# Patient Record
Sex: Male | Born: 1945 | Race: White | Hispanic: No | Marital: Married | Smoking: Never smoker
Health system: Southern US, Community
[De-identification: ages and names within clinical notes are randomized; demographics above are authoritative.]

## PROBLEM LIST (undated history)

## (undated) HISTORY — PX: KNEE ARTHROPLASTY: SHX992

## (undated) HISTORY — PX: TONSILLECTOMY: SUR1361

---

## 2005-02-21 ENCOUNTER — Ambulatory Visit: Payer: Self-pay | Admitting: Family Medicine

## 2005-05-31 ENCOUNTER — Ambulatory Visit: Payer: Self-pay | Admitting: Family Medicine

## 2005-11-22 ENCOUNTER — Ambulatory Visit: Payer: Self-pay | Admitting: Family Medicine

## 2006-05-16 ENCOUNTER — Ambulatory Visit: Payer: Self-pay | Admitting: Family Medicine

## 2015-08-22 ENCOUNTER — Emergency Department (HOSPITAL_COMMUNITY): Payer: Medicare Other

## 2015-08-22 ENCOUNTER — Emergency Department (HOSPITAL_COMMUNITY)
Admission: EM | Admit: 2015-08-22 | Discharge: 2015-08-22 | Disposition: A | Discharge status: Home / Self Care | Payer: Medicare Other | Attending: Emergency Medicine | Admitting: Emergency Medicine

## 2015-08-22 ENCOUNTER — Encounter (HOSPITAL_COMMUNITY): Payer: Self-pay | Admitting: Emergency Medicine

## 2015-08-22 DIAGNOSIS — R1030 Lower abdominal pain, unspecified: Secondary | ICD-10-CM | POA: Insufficient documentation

## 2015-08-22 LAB — URINALYSIS, ROUTINE W REFLEX MICROSCOPIC
Bilirubin Urine: NEGATIVE
GLUCOSE, UA: NEGATIVE mg/dL
Hgb urine dipstick: NEGATIVE
Ketones, ur: NEGATIVE mg/dL
LEUKOCYTES UA: NEGATIVE
NITRITE: NEGATIVE
PH: 7 (ref 5.0–8.0)
Protein, ur: NEGATIVE mg/dL
SPECIFIC GRAVITY, URINE: 1.01 (ref 1.005–1.030)

## 2015-08-22 LAB — COMPREHENSIVE METABOLIC PANEL
ALT: 19 U/L (ref 17–63)
ANION GAP: 6 (ref 5–15)
AST: 22 U/L (ref 15–41)
Albumin: 3.9 g/dL (ref 3.5–5.0)
Alkaline Phosphatase: 45 U/L (ref 38–126)
BUN: 12 mg/dL (ref 6–20)
CHLORIDE: 106 mmol/L (ref 101–111)
CO2: 26 mmol/L (ref 22–32)
Calcium: 8.6 mg/dL — ABNORMAL LOW (ref 8.9–10.3)
Creatinine, Ser: 0.83 mg/dL (ref 0.61–1.24)
GFR calc Af Amer: >60 mL/min (ref >60)
Glucose, Bld: 95 mg/dL (ref 65–99)
POTASSIUM: 3.9 mmol/L (ref 3.5–5.1)
Sodium: 138 mmol/L (ref 135–145)
Total Bilirubin: 0.5 mg/dL (ref 0.3–1.2)
Total Protein: 7.2 g/dL (ref 6.5–8.1)

## 2015-08-22 LAB — CBC WITH DIFFERENTIAL/PLATELET
BASOS ABS: 0 10*3/uL (ref 0.0–0.1)
BASOS PCT: 0 %
Eosinophils Absolute: 0.1 10*3/uL (ref 0.0–0.7)
Eosinophils Relative: 2 %
HEMATOCRIT: 38.3 % — AB (ref 39.0–52.0)
Hemoglobin: 13.4 g/dL (ref 13.0–17.0)
LYMPHS PCT: 31 %
Lymphs Abs: 1.7 10*3/uL (ref 0.7–4.0)
MCH: 30.9 pg (ref 26.0–34.0)
MCHC: 35 g/dL (ref 30.0–36.0)
MCV: 88.2 fL (ref 78.0–100.0)
MONO ABS: 0.4 10*3/uL (ref 0.1–1.0)
Monocytes Relative: 7 %
NEUTROS ABS: 3.2 10*3/uL (ref 1.7–7.7)
NEUTROS PCT: 60 %
Platelets: 211 10*3/uL (ref 150–400)
RBC: 4.34 MIL/uL (ref 4.22–5.81)
RDW: 12 % (ref 11.5–15.5)
WBC: 5.4 10*3/uL (ref 4.0–10.5)

## 2015-08-22 LAB — LIPASE, BLOOD: LIPASE: 51 U/L (ref 11–51)

## 2015-08-22 MED ORDER — ONDANSETRON HCL 4 MG/2ML IJ SOLN
4.0000 mg | Freq: Once | INTRAMUSCULAR | Status: AC
  Administered 2015-08-22: 4 mg via INTRAVENOUS
  Filled 2015-08-22: qty 2

## 2015-08-22 MED ORDER — OXYCODONE-ACETAMINOPHEN 5-325 MG PO TABS: 1.0000 | ORAL_TABLET | ORAL | Status: AC | PRN

## 2015-08-22 MED ORDER — MORPHINE SULFATE (PF) 4 MG/ML IV SOLN
4.0000 mg | Freq: Once | INTRAVENOUS | Status: AC
  Administered 2015-08-22: 4 mg via INTRAVENOUS
  Filled 2015-08-22: qty 1

## 2015-08-22 MED ORDER — SODIUM CHLORIDE 0.9 % IV SOLN
Freq: Once | INTRAVENOUS | Status: AC
  Administered 2015-08-22: 100 mL/h via INTRAVENOUS

## 2015-08-22 MED ORDER — IOHEXOL 300 MG/ML  SOLN
100.0000 mL | Freq: Once | INTRAMUSCULAR | Status: AC | PRN
  Administered 2015-08-22: 100 mL via INTRAVENOUS

## 2015-08-22 NOTE — ED Provider Notes (Signed)
CSN: [id number]     Arrival date & time 08/22/15  0204 History   First MD Initiated Contact with Patient 08/22/15 [phone]     Chief Complaint  Patient presents with  . Abdominal Pain     (Consider location/radiation/quality/duration/timing/severity/associated sxs/prior Treatment) Patient is a 70 y.o. male presenting with abdominal pain. The history is provided by the patient.  Abdominal Pain He started getting sick all week ago with diarrhea which had resolved. He then developed some pain in the suprapubic area. He states it as a grabbing, steady pain. He went to the [facility] last night and was told that he had a estimate virus and was given a prescription for dicyclomine. However, today, that pain got significantly worse. He was writhing on the floor at home. He rated pain at 10/10. There is no associated nausea and no fever or chills. He states that he did go fishing and got his boat out of the water with no worsening of the pain with that type of exertion. Also, after his diarrhea subsided, he thought he might of become constipated and he gave himself an enema yesterday with successful reduction of a bowel movement but no change in his pain. He's never had symptoms like this before.  History reviewed. No pertinent past medical history. Past Surgical History  Procedure Laterality Date  . Tonsillectomy    . Knee arthroplasty      right   No family history on file. Social History  Substance Use Topics  . Smoking status: Never Smoker   . Smokeless tobacco: None  . Alcohol Use: No    Review of Systems  Gastrointestinal: Positive for abdominal pain.  All other systems reviewed and are negative.     Allergies  Review of patient's allergies indicates not on file.  Home Medications   Prior to Admission medications   Not on File   BP 157/92 mmHg  Pulse 55  Temp(Src) 97.7 F (36.5 C) (Oral)  Resp 20  Ht 6' (1.829 m)  Wt 185 lb (83.915 kg)  BMI 25.08 kg/m2  SpO2  97% Physical Exam  Nursing note and vitals reviewed.  70 year old male, resting comfortably and in no acute distress. Vital signs are significant for hypertension and bradycardia. Oxygen saturation is 97%, which is normal. Head is normocephalic and atraumatic. PERRLA, EOMI. Oropharynx is clear. Neck is nontender and supple without adenopathy or JVD. Back is nontender and there is no CVA tenderness. Lungs are clear without rales, wheezes, or rhonchi. Chest is nontender. Heart has regular rate and rhythm without murmur. Abdomen is soft, flat, nontender without masses or hepatosplenomegaly and peristalsis is hypoactive. Extremities have no cyanosis or edema, full range of motion is present. Skin is warm and dry without rash. Neurologic: Mental status is normal, cranial nerves are intact, there are no motor or sensory deficits.  ED Course  Procedures (including critical care time) Labs Review Results for orders placed or performed during the hospital encounter of 08/22/15  Comprehensive metabolic panel  Result Value Ref Range   Sodium 138 135 - 145 mmol/L   Potassium 3.9 3.5 - 5.1 mmol/L   Chloride 106 101 - 111 mmol/L   CO2 26 22 - 32 mmol/L   Glucose, Bld 95 65 - 99 mg/dL   BUN 12 6 - 20 mg/dL   Creatinine, Ser 1.19 0.61 - 1.24 mg/dL   Calcium 8.6 (L) 8.9 - 10.3 mg/dL   Total Protein 7.2 6.5 - 8.1 g/dL   Albumin  3.9 3.5 - 5.0 g/dL   AST 22 15 - 41 U/L   ALT 19 17 - 63 U/L   Alkaline Phosphatase 45 38 - 126 U/L   Total Bilirubin 0.5 0.3 - 1.2 mg/dL   GFR calc non Af Amer >60 >60 mL/min   GFR calc Af Amer >60 >60 mL/min   Anion gap 6 5 - 15  Lipase, blood  Result Value Ref Range   Lipase 51 11 - 51 U/L  CBC with Differential  Result Value Ref Range   WBC 5.4 4.0 - 10.5 K/uL   RBC 4.34 4.22 - 5.81 MIL/uL   Hemoglobin 13.4 13.0 - 17.0 g/dL   HCT 38.3 (L)16.109.6 - 04.5 %   MCV 88.2 78.0 - 100.0 fL   MCH 30.9 26.0 - 34.0 pg   MCHC 35.0 30.0 - 36.0 g/dL   RDW 40.9 81.1 - 91.4  %   Platelets 211 150 - 400 K/uL   Neutrophils Relative % 60 %   Neutro Abs 3.2 1.7 - 7.7 K/uL   Lymphocytes Relative 31 %   Lymphs Abs 1.7 0.7 - 4.0 K/uL   Monocytes Relative 7 %   Monocytes Absolute 0.4 0.1 - 1.0 K/uL   Eosinophils Relative 2 %   Eosinophils Absolute 0.1 0.0 - 0.7 K/uL   Basophils Relative 0 %   Basophils Absolute 0.0 0.0 - 0.1 K/uL  Urinalysis, Routine w reflex microscopic  Result Value Ref Range   Color, Urine YELLOW YELLOW   APPearance CLEAR CLEAR   Specific Gravity, Urine 1.010 1.005 - 1.030   pH 7.0 5.0 - 8.0   Glucose, UA NEGATIVE NEGATIVE mg/dL   Hgb urine dipstick NEGATIVE NEGATIVE   Bilirubin Urine NEGATIVE NEGATIVE   Ketones, ur NEGATIVE NEGATIVE mg/dL   Protein, ur NEGATIVE NEGATIVE mg/dL   Nitrite NEGATIVE NEGATIVE   Leukocytes, UA NEGATIVE NEGATIVE   Imaging Review Ct Abdomen Pelvis W Contrast  08/22/2015  CLINICAL DATA:  Acute onset of bilateral lower quadrant abdominal pain, nausea and vomiting. Initial encounter. EXAM: CT ABDOMEN AND PELVIS WITH CONTRAST TECHNIQUE: Multidetector CT imaging of the abdomen and pelvis was performed using the standard protocol following bolus administration of intravenous contrast. CONTRAST:  OMNIPAQUE IOHEXOL 300 MG/ML  SOLN COMPARISON:  None. FINDINGS: Minimal right basilar atelectasis is noted. Diffuse coronary artery calcifications are seen. Hypodensities within the liver measure up to 2.7 cm in size. These are nonspecific but may reflect small cysts. The spleen is unremarkable in appearance. The gallbladder is within normal limits. The pancreas and adrenal glands are unremarkable. The kidneys are unremarkable in appearance. There is no evidence of hydronephrosis. No renal or ureteral stones are seen. No perinephric stranding is appreciated. No free fluid is identified. The small bowel is unremarkable in appearance. The stomach is within normal limits. No acute vascular abnormalities are seen. Minimal calcification  is noted along the abdominal aorta and its branches. The appendix is normal in caliber and somewhat evidence of appendicitis. The colon is unremarkable in appearance. The bladder is moderately distended and grossly unremarkable. The prostate is mildly enlarged, measuring 5.2 cm in transverse dimension, with minimal calcification. No inguinal lymphadenopathy is seen. No acute osseous abnormalities are identified. IMPRESSION: 1. No acute abnormality seen within the abdomen or pelvis. 2. Nonspecific hypodensities within the liver may reflect small cysts. 3. Diffuse coronary artery calcifications seen. 4. Mildly enlarged prostate. Electronically Signed   By: Roanna Raider M.D.   On: 08/22/2015 06:23  I have personally reviewed and evaluated these images and lab results as part of my medical decision-making.   MDM   Final diagnoses:  Suprapubic pain, acute, unspecified laterality    Suprapubic pain of uncertain cause. He had received a dose of morphine in the ED prior to my seeing him and he states he feels much better following that. Laboratory workup is unremarkable with normal CBC and urinalysis. His description of pain at home and is suspicious for possible renal colic. He will be sent for CT of abdomen and pelvis. Old records are reviewed and he has no relevant past visits.   CT is unremarkable. Cause for pain is obscure. He continues to be pain-free following initial small dose of  Morphine.  Patient is reassured of negative CT scan and discharged with prescription for oxycodone have acetaminophen. Return precautions given.  Dione Booze, MD 08/22/15 [phone]

## 2015-08-22 NOTE — ED Notes (Signed)
Pt states he had diarrhea earlier last week, seen at [place] thought was stomach virus, pain getting worse.

## 2015-08-22 NOTE — Discharge Instructions (Signed)
Your tests did not show the cause for ear pain. Urinalysis showed no evidence of infection , and CT scan showed no evidence of infection or kidney stone. Cathie Hoops may take the oxycodone-acetaminophen as needed for pain, but return if pain seems to be getting worse or if you start running a fever.  Abdominal Pain, Adult Many things can cause abdominal pain. Usually, abdominal pain is not caused by a disease and will improve without treatment. It can often be observed and treated at home. Your health care provider will do a physical exam and possibly order blood tests and X-rays to help determine the seriousness of your pain. However, in many cases, more time must pass before a clear cause of the pain can be found. Before that point, your health care provider may not know if you need more testing or further treatment. HOME CARE INSTRUCTIONS Monitor your abdominal pain for any changes. The following actions may help to alleviate any discomfort you are experiencing:  Only take over-the-counter or prescription medicines as directed by your health care provider.  Do not take laxatives unless directed to do so by your health care provider.  Try a clear liquid diet (broth, tea, or water) as directed by your health care provider. Slowly move to a bland diet as tolerated. SEEK MEDICAL CARE IF:  You have unexplained abdominal pain.  You have abdominal pain associated with nausea or diarrhea.  You have pain when you urinate or have a bowel movement.  You experience abdominal pain that wakes you in the night.  You have abdominal pain that is worsened or improved by eating food.  You have abdominal pain that is worsened with eating fatty foods.  You have a fever. SEEK IMMEDIATE MEDICAL CARE IF:  Your pain does not go away within 2 hours.  You keep throwing up (vomiting).  Your pain is felt only in portions of the abdomen, such as the right side or the left lower portion of the abdomen.  You pass  bloody or black tarry stools. MAKE SURE YOU:  Understand these instructions.  Will watch your condition.  Will get help right away if you are not doing well or get worse.   This information is not intended to replace advice given to you by your health care provider. Make sure you discuss any questions you have with your health care provider.   Document Released: 03/22/2005 Document Revised: 03/03/2015 Document Reviewed: 02/19/2013 Elsevier Interactive Patient Education 2016 [employer]  Acetaminophen; Oxycodone tablets What is this medicine? ACETAMINOPHEN; OXYCODONE (a set a MEE noe fen; ox i KOE done) is a pain reliever. It is used to treat moderate to severe pain. This medicine may be used for other purposes; ask your health care provider or pharmacist if you have questions. What should I tell my health care provider before I take this medicine? They need to know if you have any of these conditions: -brain tumor -Crohn's disease, inflammatory bowel disease, or ulcerative colitis -drug abuse or addiction -head injury -heart or circulation problems -if you often drink alcohol -kidney disease or problems going to the bathroom -liver disease -lung disease, asthma, or breathing problems -an unusual or allergic reaction to acetaminophen, oxycodone, other opioid analgesics, other medicines, foods, dyes, or preservatives -pregnant or trying to get pregnant -breast-feeding How should I use this medicine? Take this medicine by mouth with a full glass of water. Follow the directions on the prescription label. You can take it with or without food. If it  upsets your stomach, take it with food. Take your medicine at regular intervals. Do not take it more often than directed. Talk to your pediatrician regarding the use of this medicine in children. Special care may be needed. Patients over 32 years old may have a stronger reaction and need a smaller dose. Overdosage: If you think you have  taken too much of this medicine contact a poison control center or emergency room at once. NOTE: This medicine is only for you. Do not share this medicine with others. What if I miss a dose? If you miss a dose, take it as soon as you can. If it is almost time for your next dose, take only that dose. Do not take double or extra doses. What may interact with this medicine? -alcohol -antihistamines -barbiturates like amobarbital, butalbital, butabarbital, methohexital, pentobarbital, phenobarbital, thiopental, and secobarbital -benztropine -drugs for bladder problems like solifenacin, trospium, oxybutynin, tolterodine, hyoscyamine, and methscopolamine -drugs for breathing problems like ipratropium and tiotropium -drugs for certain stomach or intestine problems like propantheline, homatropine methylbromide, glycopyrrolate, atropine, belladonna, and dicyclomine -general anesthetics like etomidate, ketamine, nitrous oxide, propofol, desflurane, enflurane, halothane, isoflurane, and sevoflurane -medicines for depression, anxiety, or psychotic disturbances -medicines for sleep -muscle relaxants -naltrexone -narcotic medicines (opiates) for pain -phenothiazines like perphenazine, thioridazine, chlorpromazine, mesoridazine, fluphenazine, prochlorperazine, promazine, and trifluoperazine -scopolamine -tramadol -trihexyphenidyl This list may not describe all possible interactions. Give your health care provider a list of all the medicines, herbs, non-prescription drugs, or dietary supplements you use. Also tell them if you smoke, drink alcohol, or use illegal drugs. Some items may interact with your medicine. What should I watch for while using this medicine? Tell your doctor or health care professional if your pain does not go away, if it gets worse, or if you have new or a different type of pain. You may develop tolerance to the medicine. Tolerance means that you will need a higher dose of the  medication for pain relief. Tolerance is normal and is expected if you take this medicine for a long time. Do not suddenly stop taking your medicine because you may develop a severe reaction. Your body becomes used to the medicine. This does NOT mean you are addicted. Addiction is a behavior related to getting and using a drug for a non-medical reason. If you have pain, you have a medical reason to take pain medicine. Your doctor will tell you how much medicine to take. If your doctor wants you to stop the medicine, the dose will be slowly lowered over time to avoid any side effects. You may get drowsy or dizzy. Do not drive, use machinery, or do anything that needs mental alertness until you know how this medicine affects you. Do not stand or sit up quickly, especially if you are an older patient. This reduces the risk of dizzy or fainting spells. Alcohol may interfere with the effect of this medicine. Avoid alcoholic drinks. There are different types of narcotic medicines (opiates) for pain. If you take more than one type at the same time, you may have more side effects. Give your health care provider a list of all medicines you use. Your doctor will tell you how much medicine to take. Do not take more medicine than directed. Call emergency for help if you have problems breathing. The medicine will cause constipation. Try to have a bowel movement at least every 2 to 3 days. If you do not have a bowel movement for 3 days, call your doctor or health  care professional. Do not take Tylenol (acetaminophen) or medicines that have acetaminophen with this medicine. Too much acetaminophen can be very dangerous. Many nonprescription medicines contain acetaminophen. Always read the labels carefully to avoid taking more acetaminophen. What side effects may I notice from receiving this medicine? Side effects that you should report to your doctor or health care professional as soon as possible: -allergic reactions like  skin rash, itching or hives, swelling of the face, lips, or tongue -breathing difficulties, wheezing -confusion -light headedness or fainting spells -severe stomach pain -unusually weak or tired -yellowing of the skin or the whites of the eyes Side effects that usually do not require medical attention (report to your doctor or health care professional if they continue or are bothersome): -dizziness -drowsiness -nausea -vomiting This list may not describe all possible side effects. Call your doctor for medical advice about side effects. You may report side effects to FDA at 1-800-FDA-1088. Where should I keep my medicine? Keep out of the reach of children. This medicine can be abused. Keep your medicine in a safe place to protect it from theft. Do not share this medicine with anyone. Selling or giving away this medicine is dangerous and against the law. This medicine may cause accidental overdose and death if it taken by other adults, children, or pets. Mix any unused medicine with a substance like cat litter or coffee grounds. Then throw the medicine away in a sealed container like a sealed bag or a coffee can with a lid. Do not use the medicine after the expiration date. Store at room temperature between 20 and 25 degrees C (68 and 77 degrees F). NOTE: This sheet is a summary. It may not cover all possible information. If you have questions about this medicine, talk to your doctor, pharmacist, or health care provider.    2016, Elsevier/Gold Standard. (2014-05-13 15:18:46)

## 2015-08-22 NOTE — ED Notes (Signed)
Pt tearful severe  lower abdominal pain. Last BM 6pm last night after using enema, denies nausea or vomiting.

## 2015-08-22 NOTE — ED Notes (Signed)
Patient given discharge instruction, verbalized understand. IV removed, band aid applied. Patient ambulatory out of the department.  

## 2017-06-12 IMAGING — CT CT ABD-PELV W/ CM
2 of 5 series · 16 of 46 positions shown, 18 images · IV contrast (Omnipaque 300)
Comparison: None.

CLINICAL DATA: Acute onset of bilateral lower quadrant abdominal
pain, nausea and vomiting. Initial encounter.

EXAM:
CT ABDOMEN AND PELVIS WITH CONTRAST
TECHNIQUE: Multidetector CT imaging of the abdomen and pelvis was performed
using the standard protocol following bolus administration of
intravenous contrast.
CONTRAST:  100mL OMNIPAQUE IOHEXOL 300 MG/ML  SOLN

[Series 2: abd_pel_with 5.0 b40f · axial · 0.74mm/px · z∈[-461,-21]mm · 13 of 100 slices shown, 15 images]
[im 6/100  soft-tissue]
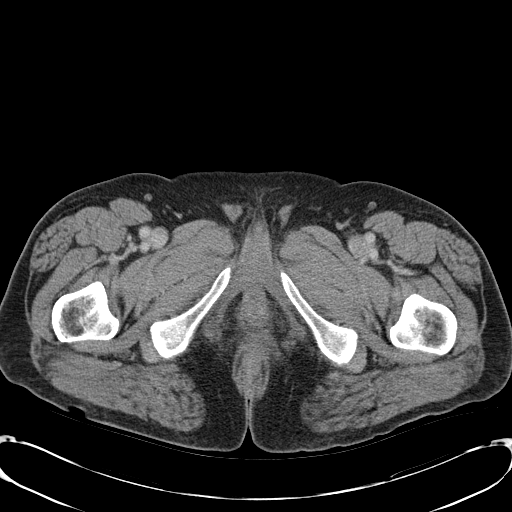
[im 6/100  bone]
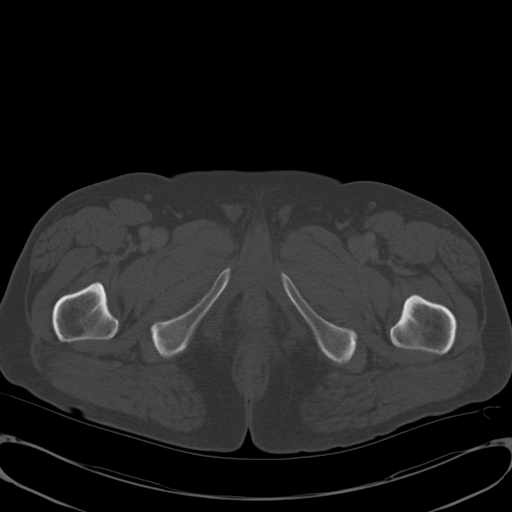
[im 12/100  soft-tissue]
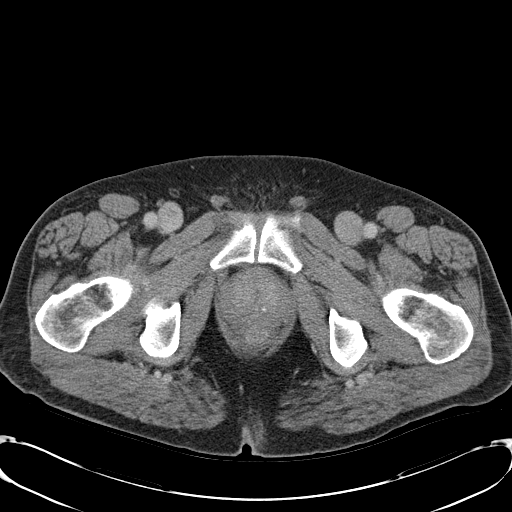
[im 24/100  soft-tissue]
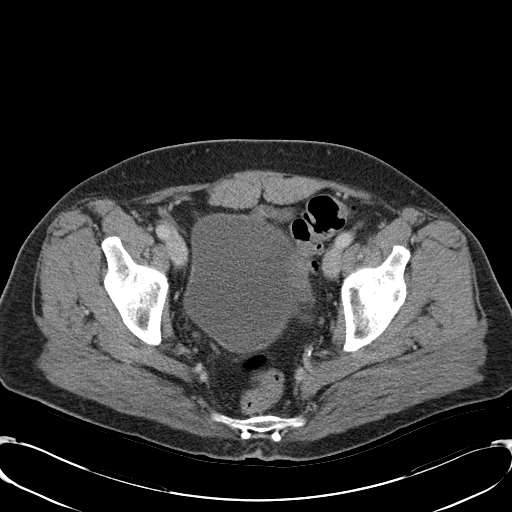
[im 30/100  soft-tissue]
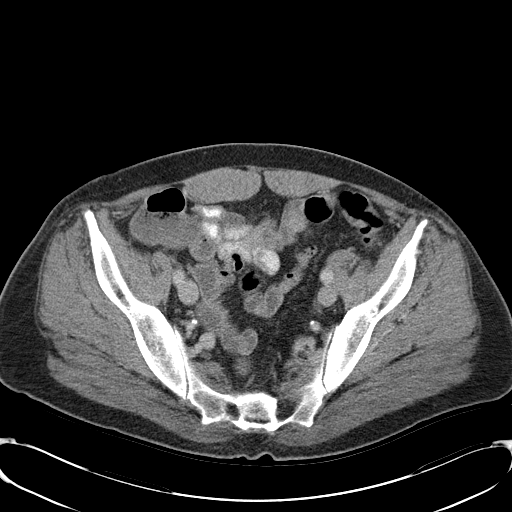
[im 35/100  soft-tissue]
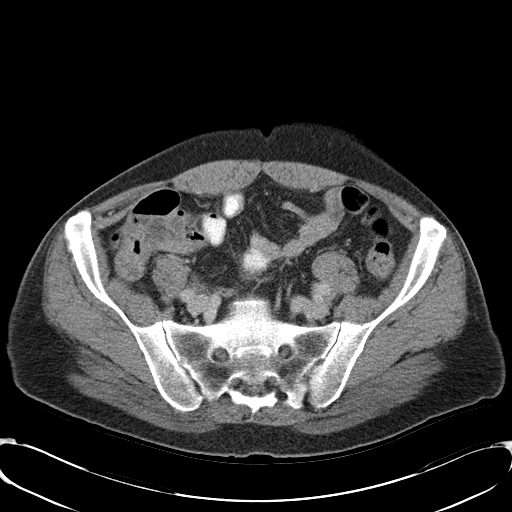
[im 41/100  soft-tissue]
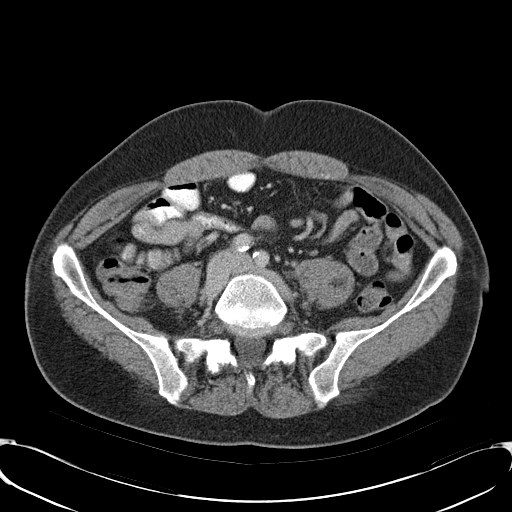
[im 53/100  soft-tissue]
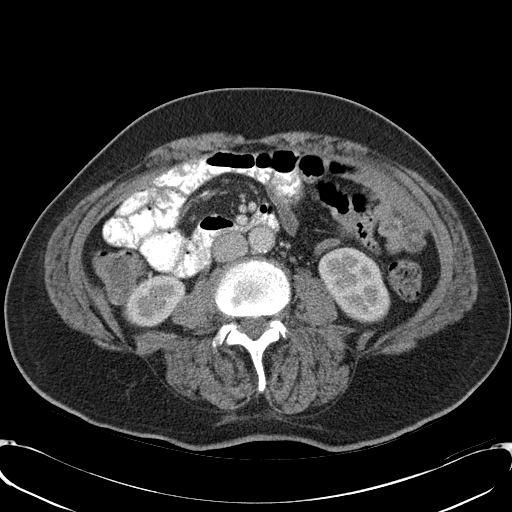
[im 59/100  soft-tissue]
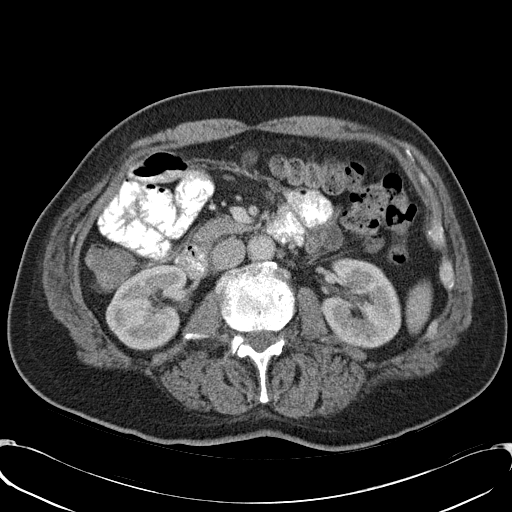
[im 65/100  soft-tissue]
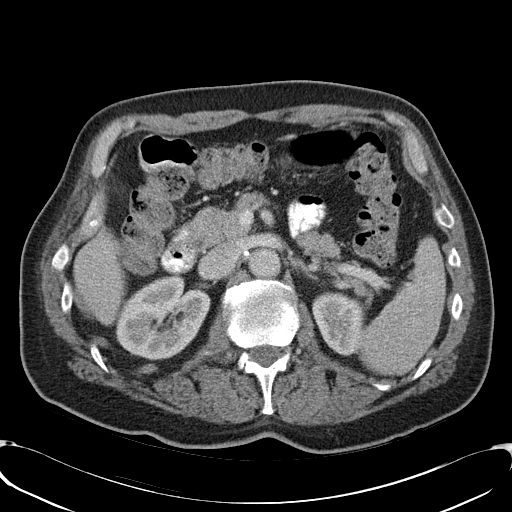
[im 65/100  bone]
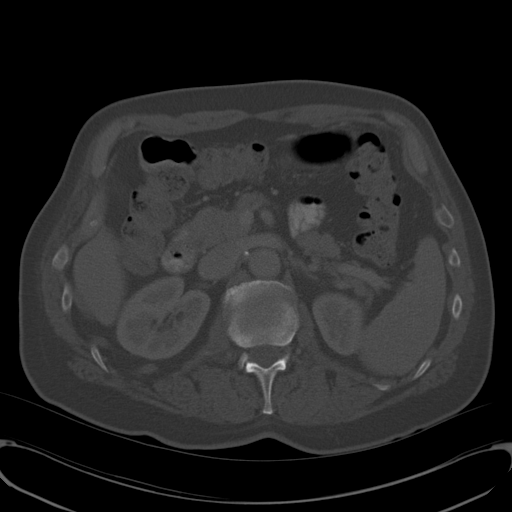
[im 70/100  soft-tissue]
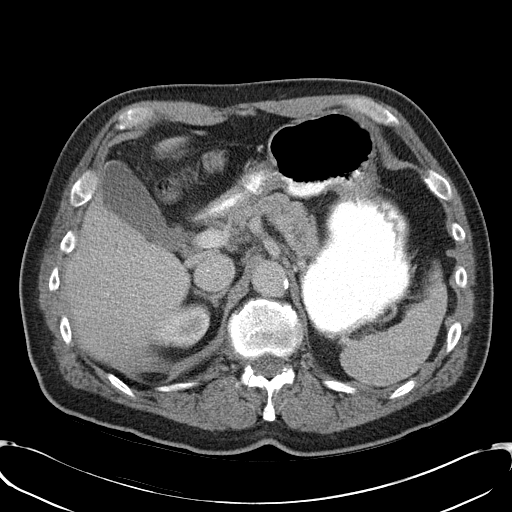
[im 76/100  soft-tissue]
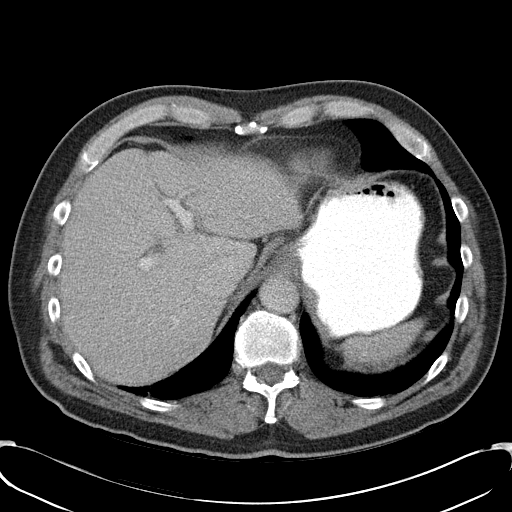
[im 88/100  soft-tissue]
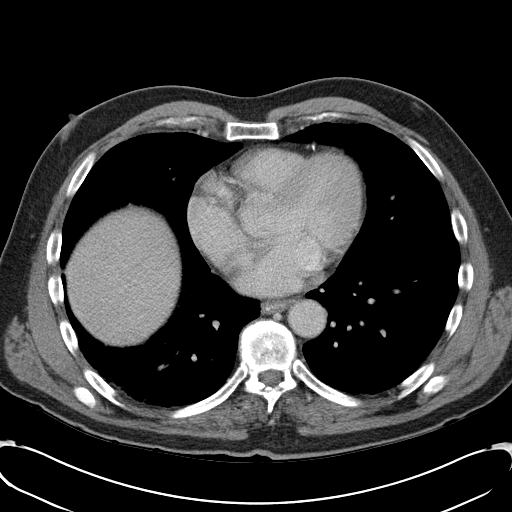
[im 94/100  soft-tissue]
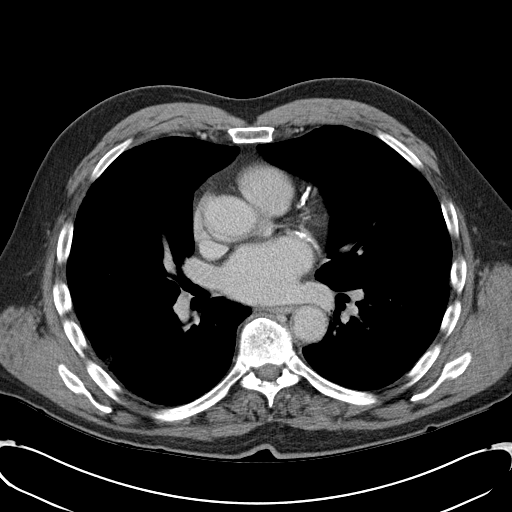

[Series 3: abd_pel_with 3.0 spo cor · coronal · 0.75mm/px · 3 of 99 slices shown]
[im 33/99  soft-tissue]
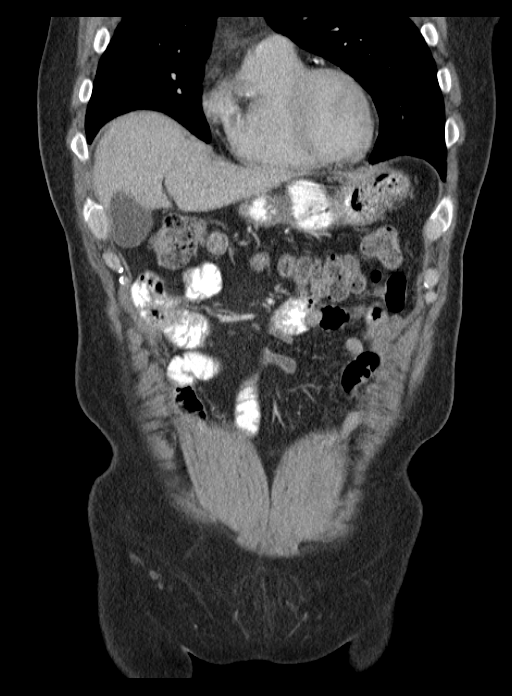
[im 44/99  soft-tissue]
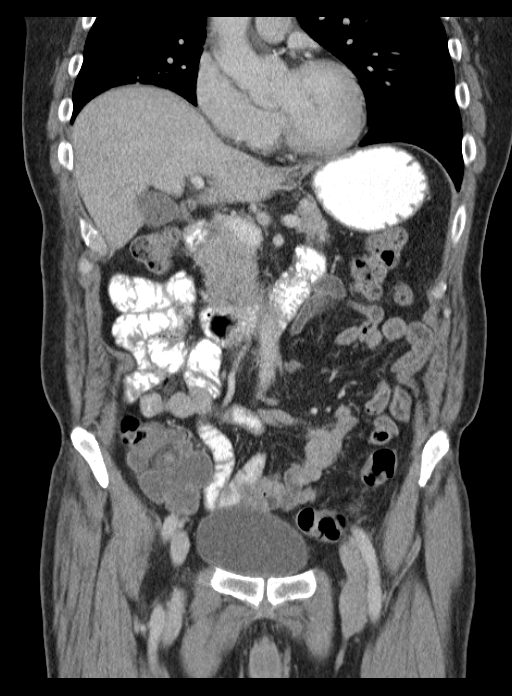
[im 55/99  soft-tissue]
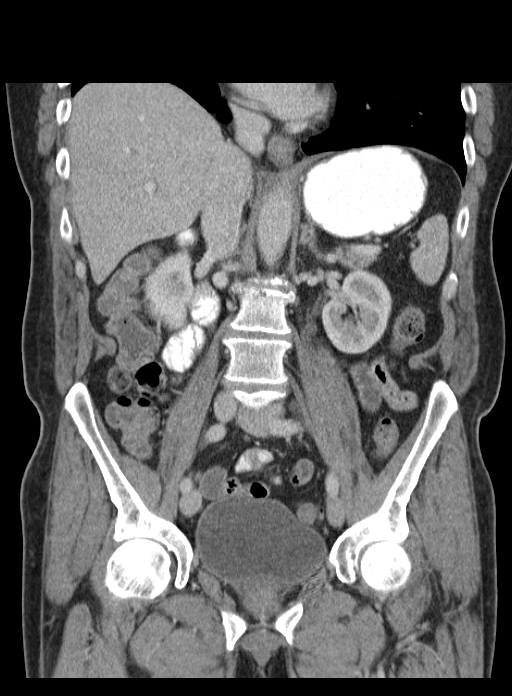

[16 of 46 positions shown; findings below may reference images not displayed]

FINDINGS: Minimal right basilar atelectasis is noted. Diffuse coronary artery
calcifications are seen.

Hypodensities within the liver measure up to 2.7 cm in size. These
are nonspecific but may reflect small cysts. The spleen is
unremarkable in appearance. The gallbladder is within normal limits.
The pancreas and adrenal glands are unremarkable.

The kidneys are unremarkable in appearance. There is no evidence of
hydronephrosis. No renal or ureteral stones are seen. No perinephric
stranding is appreciated.

No free fluid is identified. The small bowel is unremarkable in
appearance. The stomach is within normal limits. No acute vascular
abnormalities are seen. Minimal calcification is noted along the
abdominal aorta and its branches.

The appendix is normal in caliber and somewhat evidence of
appendicitis. The colon is unremarkable in appearance.

The bladder is moderately distended and grossly unremarkable. The
prostate is mildly enlarged, measuring 5.2 cm in transverse
dimension, with minimal calcification. No inguinal lymphadenopathy
is seen.

No acute osseous abnormalities are identified.
IMPRESSION: 1. No acute abnormality seen within the abdomen or pelvis.
2. Nonspecific hypodensities within the liver may reflect small
cysts.
3. Diffuse coronary artery calcifications seen.
4. Mildly enlarged prostate.

## 2017-12-24 DEATH — deceased
